# Patient Record
Sex: Female | Born: 1945 | Race: White | Hispanic: No | Marital: Married | Smoking: Never smoker
Health system: Southern US, Community
[De-identification: ages and names within clinical notes are randomized; demographics above are authoritative.]

## PROBLEM LIST (undated history)

## (undated) DIAGNOSIS — E7800 Pure hypercholesterolemia, unspecified: Secondary | ICD-10-CM

## (undated) DIAGNOSIS — I10 Essential (primary) hypertension: Secondary | ICD-10-CM

## (undated) DIAGNOSIS — M1711 Unilateral primary osteoarthritis, right knee: Secondary | ICD-10-CM

## (undated) HISTORY — PX: CHOLECYSTECTOMY: SHX55

---

## 2016-08-13 ENCOUNTER — Emergency Department (HOSPITAL_BASED_OUTPATIENT_CLINIC_OR_DEPARTMENT_OTHER): Payer: Medicare Other

## 2016-08-13 ENCOUNTER — Encounter (HOSPITAL_BASED_OUTPATIENT_CLINIC_OR_DEPARTMENT_OTHER): Payer: Self-pay | Admitting: *Deleted

## 2016-08-13 ENCOUNTER — Emergency Department (HOSPITAL_BASED_OUTPATIENT_CLINIC_OR_DEPARTMENT_OTHER)
Admission: EM | Admit: 2016-08-13 | Discharge: 2016-08-13 | Disposition: A | Discharge status: Home / Self Care | Payer: Medicare Other | Attending: Physician Assistant | Admitting: Physician Assistant

## 2016-08-13 DIAGNOSIS — R51 Headache: Secondary | ICD-10-CM | POA: Diagnosis not present

## 2016-08-13 DIAGNOSIS — Z7982 Long term (current) use of aspirin: Secondary | ICD-10-CM | POA: Insufficient documentation

## 2016-08-13 DIAGNOSIS — I10 Essential (primary) hypertension: Secondary | ICD-10-CM | POA: Insufficient documentation

## 2016-08-13 DIAGNOSIS — M79605 Pain in left leg: Secondary | ICD-10-CM | POA: Insufficient documentation

## 2016-08-13 DIAGNOSIS — M791 Myalgia: Secondary | ICD-10-CM | POA: Diagnosis not present

## 2016-08-13 DIAGNOSIS — Z79899 Other long term (current) drug therapy: Secondary | ICD-10-CM | POA: Diagnosis not present

## 2016-08-13 HISTORY — DX: Unilateral primary osteoarthritis, right knee: M17.11

## 2016-08-13 HISTORY — DX: Essential (primary) hypertension: I10

## 2016-08-13 HISTORY — DX: Pure hypercholesterolemia, unspecified: E78.00

## 2016-08-13 NOTE — Discharge Instructions (Signed)
Please make sure you follow up with your primary doctor for mri in the outpatient setting. 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 Warm compresses with compresses with ace wraps. No signs of blood clot. Return to the Ed if you develop chest pain or shortness or breath.

## 2016-08-13 NOTE — ED Provider Notes (Signed)
[facility] Provider Note   CSN: [id number] Arrival date & time: 08/13/16  1930   By signing my name below, I, Thelma Bargeick Cochran, attest that this documentation has been prepared under the direction and in the presence of Synetta ShadowKenneth Tyler Gerene Nedd. Electronically Signed: Thelma BargeNick Cochran, Scribe. 08/13/16. 9:39 PM.  History   Chief Complaint Chief Complaint  Patient presents with  . Leg Pain   The history is provided by the patient. No language interpreter was used.   HPI Comments: Jamie Mathews is a 71 y.o. female who presents to the Emergency Department complaining of constant left-sided leg pain that began 3 weeks ago [place] She states she thought it was her Atorvastatin medication so she stopped taking it in response. She called her PCP who recommended she come to the ED immediately for suspicion of blood clot after the pain did not improve after being off meds for 10 days. She has associated difficulty walking due to pain, minor swelling in that leg, and HA but notes this might be due to stress. She notes the pain worsens during the day and it feels like "charlie horses" that radiates to the front of her leg. Pt has no Hx of blood clots or PE. She takes aspirin daily.She denies CP and SOB. She also denies hormone replacements and any recent surgeries or hospitalizations.   Past Medical History:  Diagnosis Date  . High cholesterol   . Hypertension   . Osteoarthritis of right knee     There are no active problems to display for this patient.   Past Surgical History:  Procedure Laterality Date  . CHOLECYSTECTOMY      OB History    No data available       Home Medications    Prior to Admission medications   Medication Sig Start Date End Date Taking? Authorizing Provider  Aspirin (ASPIR-81 PO) Take by mouth.   Yes [provider]  losartan (COZAAR) 100 MG tablet Take 100 mg by mouth daily.   Yes [provider]    Family  History No family history on file.  Social History Social History  Substance Use Topics  . Smoking status: Never Smoker  . Smokeless tobacco: Never Used  . Alcohol use No     Allergies   Patient has no known allergies.   Review of Systems Review of Systems  Respiratory: Negative for shortness of breath.   Cardiovascular: Positive for leg swelling. Negative for chest pain.  Musculoskeletal: Positive for gait problem and myalgias.  Neurological: Positive for headaches.     Physical Exam Updated Vital Signs BP (!) 173/82   Pulse 77   Temp 97.9 F (36.6 C) (Oral)   Resp 18   Ht 5\' 4"  (1.626 m)   Wt 210 lb (95.3 kg)   SpO2 96%   BMI 36.05 kg/m   Physical Exam  Constitutional: She is oriented to person, place, and time. She appears well-developed and well-nourished.  HENT:  Head: Normocephalic and atraumatic.  Cardiovascular: Normal rate, regular rhythm, normal heart sounds and intact distal pulses.   Pulmonary/Chest: Effort normal and breath sounds normal.  Musculoskeletal:  Left calf TTP No ecchymosis or edema noted DP pulses 2+ bilaterally Full ROM in left knee but with pain  Neurological: She is alert and oriented to person, place, and time.  Sensation intact  Skin: Skin is warm and dry.  Capillary refill normal  Psychiatric: She has a normal mood and affect.  Nursing note and vitals reviewed.    ED Treatments / Results  DIAGNOSTIC STUDIES: Oxygen Saturation is 96% on RA, normal by my interpretation.    COORDINATION OF CARE: 9:34 PM Discussed treatment plan with pt at bedside and pt agreed to plan.  Labs (all labs ordered are listed, but only abnormal results are displayed) Labs Reviewed - No data to display  EKG  EKG Interpretation None       Radiology No results found.  Procedures Procedures (including critical care time)  Medications Ordered in ED Medications - No data to display   Initial Impression / Assessment and Plan / ED  Course  I have reviewed the triage vital signs and the nursing notes.  Pertinent labs & imaging results that were available during my care of the patient were reviewed by me and considered in my medical decision making (see chart for details).     Pt presents with right leg pain following long drive 1.5 weeks ago. Seen by pcp who sent to ED for DVT r/o. US performed that revealed no dvt but complex bakers cyst. PT is neurovascularly intact.Denies cp or sob.  Doubt PE. Symptomatic treatment discussed with pcp follow up and mri in outpatient setting. Pt is hemodynamically stable, in NAD, & able to ambulate in the ED. Pain has been managed & has no complaints prior to dc. Pt is comfortable with above plan and is stable for discharge at this time. All questions were answered prior to disposition. Strict return precautions for f/u to the ED were discussed. Pt seen by Dr. Juliann Pares who is agreeable to the above plan.   Final Clinical Impressions(s) / ED Diagnoses   Final diagnoses:  Left leg pain    New Prescriptions Discharge Medication List as of 08/13/2016 11:09 PM     I personally performed the services described in this documentation, which was scribed in my presence. The recorded information has been reviewed and is accurate.     Rise Mu, PA-C 08/15/16 1611    Abelino Derrick, MD 08/16/16 0009

## 2016-08-13 NOTE — ED Triage Notes (Signed)
Pain in her left leg since May 12th. Her MD is aware. She stopped her Atorvastatin 6 days ago with no relief. Her MD called her tonight and asked how she was and she was told to come straight to the ED to r/o DVT.

## 2018-06-17 IMAGING — US US EXTREM LOW VENOUS*L*
1 series · 13 of 24 positions shown · non-contrast
Comparison: None.

CLINICAL DATA: Pain posterior left thigh and popliteal fossa



[Series 1: us extrem low venous*left* · 0.09mm/px · 33 acquisitions, 13 frames shown]
[im 1/33]
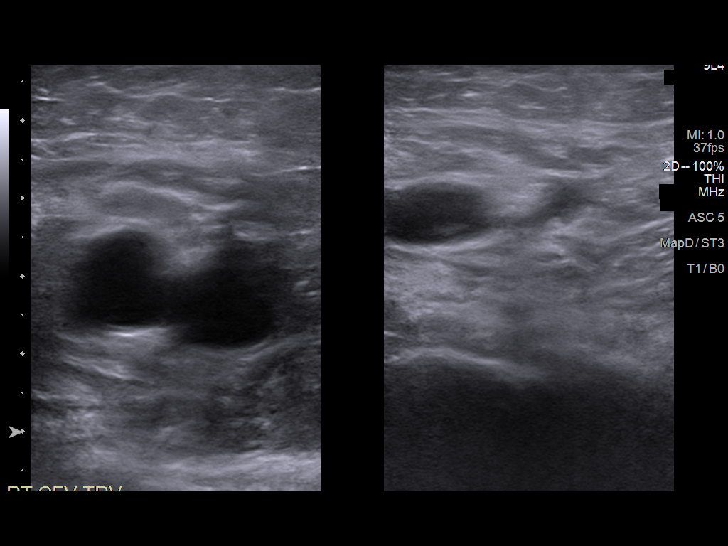
[im 3/33]
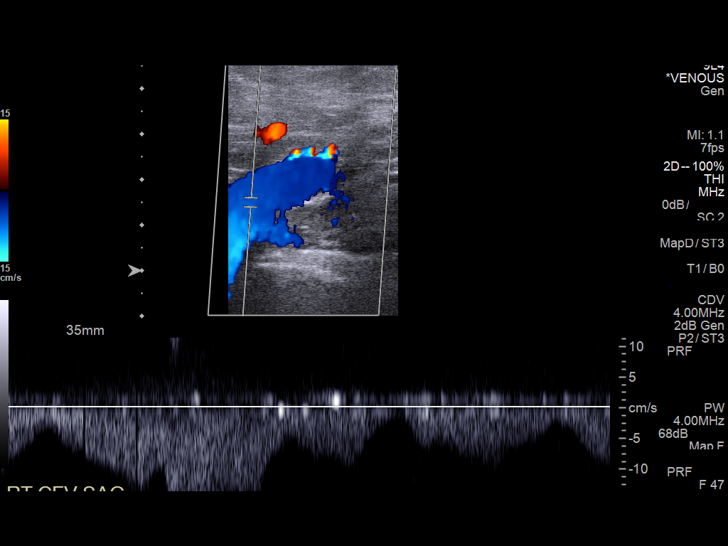
[im 6/33]
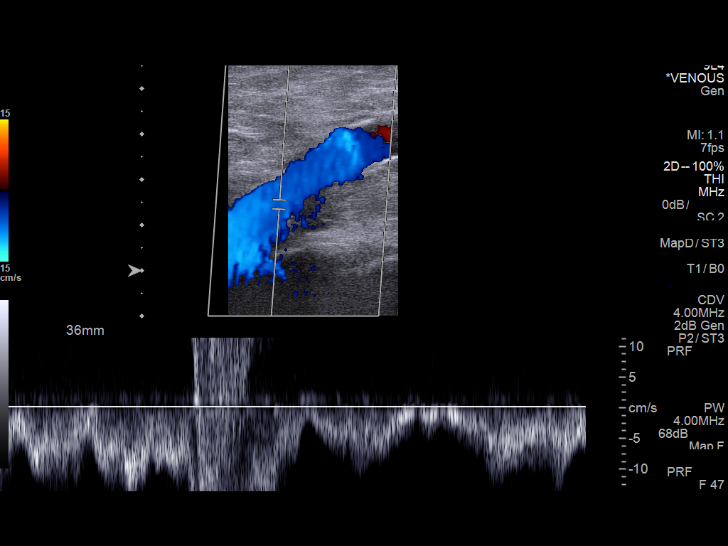
[im 9/33]
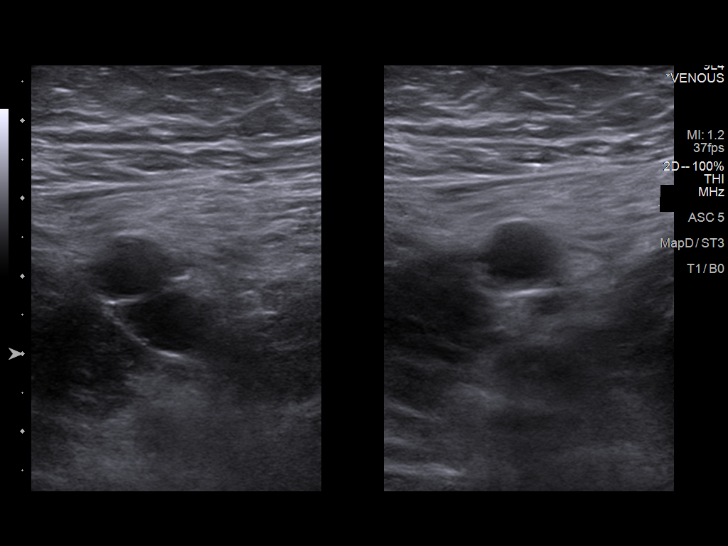
[im 12/33]
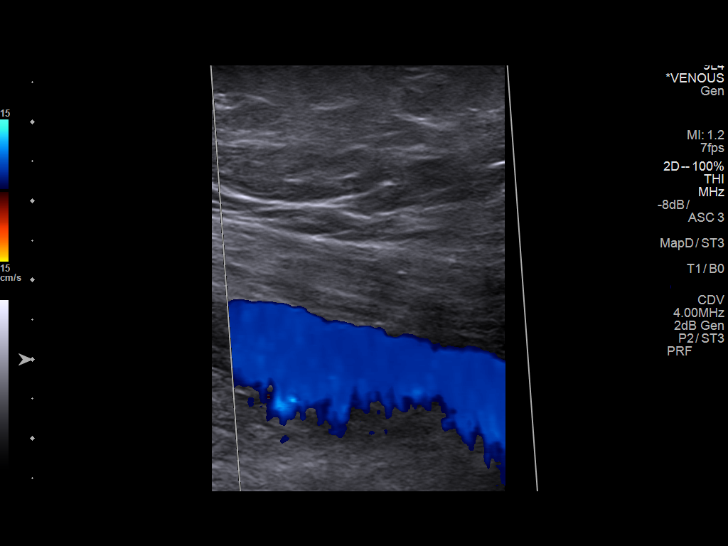
[im 14/33]
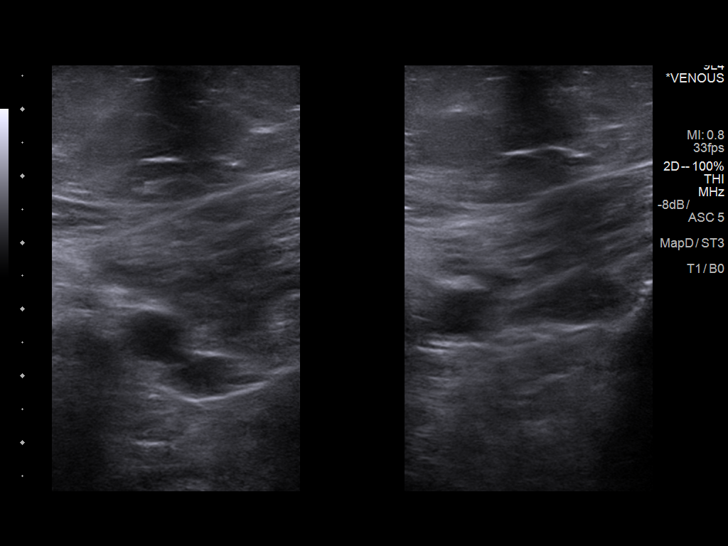
[im 19/33]
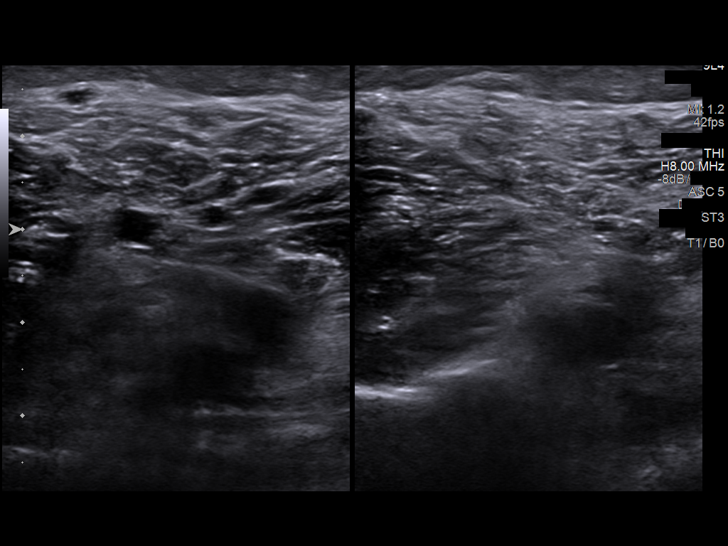
[im 20/33]
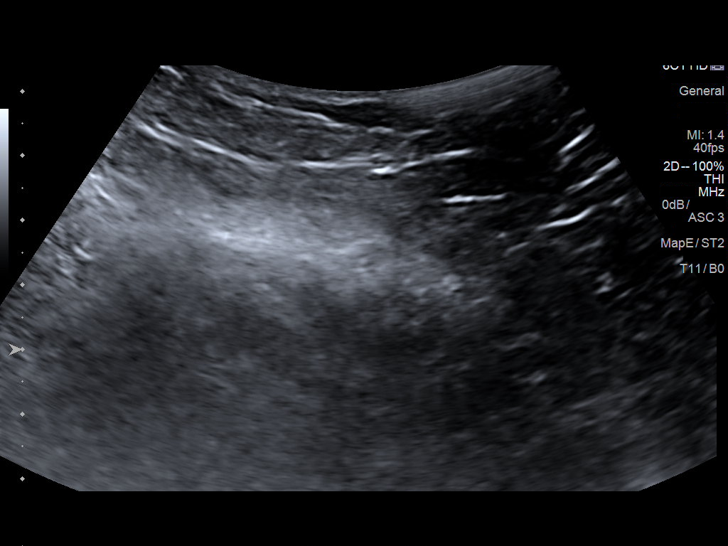
[im 21/33]
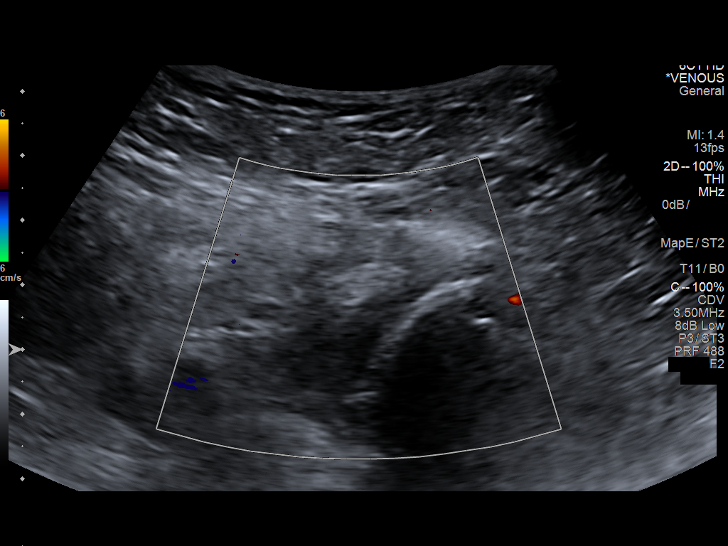
[im 24/33]
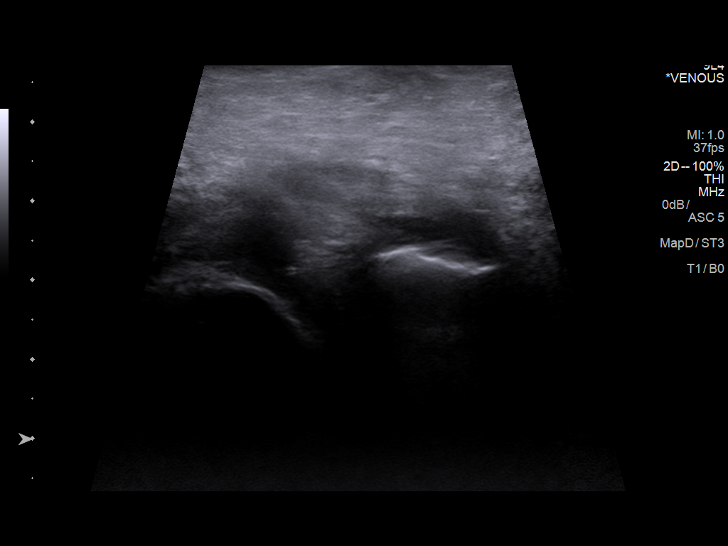
[im 27/33]
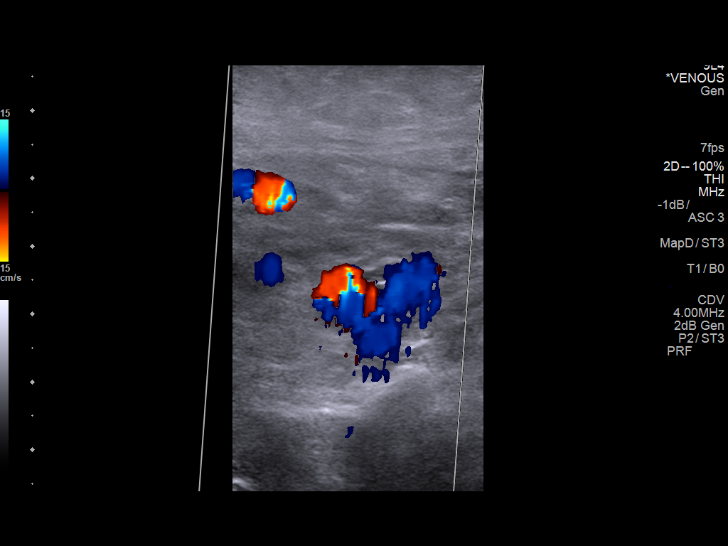
[im 30/33]
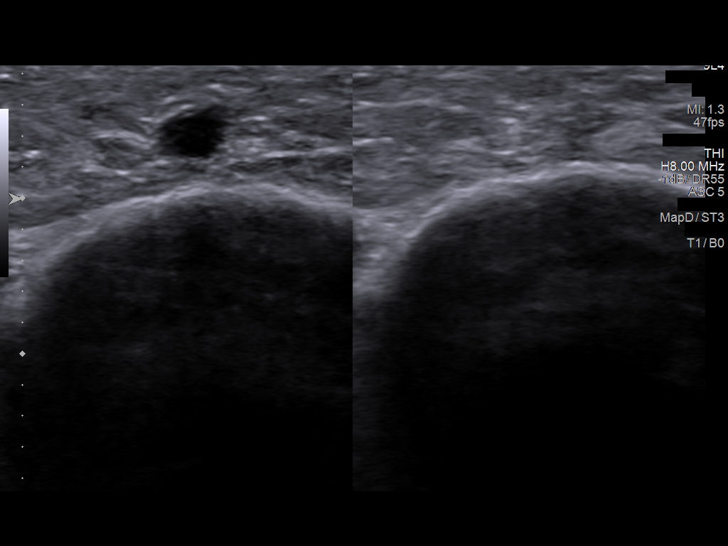
[im 33/33]
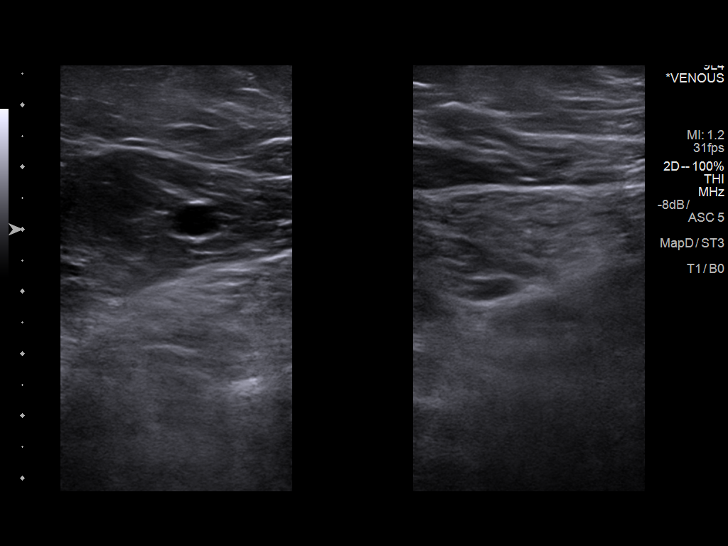

[13 of 24 positions shown; findings below may reference images not displayed]

FINDINGS: Contralateral Common Femoral Vein: Respiratory phasicity is normal
and symmetric with the symptomatic side. No evidence of thrombus.
Normal compressibility.

Common Femoral Vein: No evidence of thrombus. Normal
compressibility, respiratory phasicity and response to augmentation.

Saphenofemoral Junction: No evidence of thrombus. Normal
compressibility and flow on color Doppler imaging.

Profunda Femoral Vein: No evidence of thrombus. Normal
compressibility and flow on color Doppler imaging.

Femoral Vein: No evidence of thrombus. Normal compressibility,
respiratory phasicity and response to augmentation.

Popliteal Vein: No evidence of thrombus. Normal compressibility,
respiratory phasicity and response to augmentation.

Calf Veins: No evidence of thrombus. Normal compressibility and flow
on color Doppler imaging.

Other Findings: Heterogenous hypo echoic area in the popliteal fossa
in the region of pain measuring approximately 3.5 x 1.4 x 2 cm.
IMPRESSION: No evidence of DVT within the left lower extremity. Heterogenous
collection in the left popliteal fossa measuring 3.5 cm, possible
complicated Baker's cyst. Nonemergent MRI evaluation could be
obtained for confirmation.

## 2020-07-08 ENCOUNTER — Emergency Department (HOSPITAL_BASED_OUTPATIENT_CLINIC_OR_DEPARTMENT_OTHER)
Admission: EM | Admit: 2020-07-08 | Discharge: 2020-07-08 | Disposition: A | Discharge status: Home / Self Care | Payer: Medicare Other | Attending: Emergency Medicine | Admitting: Emergency Medicine

## 2020-07-08 ENCOUNTER — Encounter (HOSPITAL_BASED_OUTPATIENT_CLINIC_OR_DEPARTMENT_OTHER): Payer: Self-pay | Admitting: Emergency Medicine

## 2020-07-08 ENCOUNTER — Other Ambulatory Visit: Payer: Self-pay

## 2020-07-08 DIAGNOSIS — Z7982 Long term (current) use of aspirin: Secondary | ICD-10-CM | POA: Insufficient documentation

## 2020-07-08 DIAGNOSIS — I10 Essential (primary) hypertension: Secondary | ICD-10-CM | POA: Diagnosis not present

## 2020-07-08 DIAGNOSIS — M7671 Peroneal tendinitis, right leg: Secondary | ICD-10-CM | POA: Diagnosis not present

## 2020-07-08 DIAGNOSIS — M79671 Pain in right foot: Secondary | ICD-10-CM | POA: Diagnosis present

## 2020-07-08 NOTE — Discharge Instructions (Signed)
Please read the attachment on peroneal tendinopathy.  Continue with NSAIDs and Tylenol, as directed.  Please call the office of your podiatrist on Monday to schedule appointment as soon as possible.  They want to know that your symptoms have worsened and should try to expedite your appointment.  I hope that you can attend and be able to ambulate at your grandson's graduation.  Please continue to try your best to rest the foot is much as possible in interim.  Return to the ER or seek immediate medical attention should you experience any new or worsening symptoms.

## 2020-07-08 NOTE — ED Provider Notes (Signed)
[facility] EMERGENCY DEPARTMENT Provider Note   CSN: [id number] Arrival date & time: 07/08/20  1530     History Chief Complaint  Patient presents with  . Foot Pain    Jamie Mathews is a 75 y.o. female with PMH significant for DM currently being treated by a podiatrist for peroneal tendinitis who presents the ED with complaints of right foot pain.   I reviewed her medical record and she was encouraged to wear supportive shoes and ASO ankle brace.  She was evaluated 06/20/2020 and encouraged to return to the clinic in 3 to 4 weeks, sooner if needed.  She does endorse a history of plantar fasciitis, but states this feels different.  She describes a ball on the bottom of her foot.  She states that it really became painful today.  Her grandson is graduating from college next weekend and she needs to be ambulatory.  She states that when she does not walk, her pain goes away.  However, when she begins walking again, the pain returns.  She is in a wheelchair and states that she has significant pain with ambulation at this time.  She was unsure if we be able to help her here in the ED, but wanted come to the ER for evaluation.  She was encouraged by her family.  She was wondering if we could be placed in a cast that would make it easier for her to walk.  She tried buying supportive footwear, no relief.  No recent fevers, new injuries, numbness or weakness, or any other symptoms.  HPI     Past Medical History:  Diagnosis Date  . High cholesterol   . Hypertension   . Osteoarthritis of right knee     There are no problems to display for this patient.   Past Surgical History:  Procedure Laterality Date  . CHOLECYSTECTOMY       OB History   No obstetric history on file.     History reviewed. No pertinent family history.  Social History   Tobacco Use  . Smoking status: Never Smoker  . Smokeless tobacco: Never Used  Substance Use Topics  . Alcohol use: No  . Drug  use: No    Home Medications Prior to Admission medications   Medication Sig Start Date End Date Taking? Authorizing Provider  Aspirin (ASPIR-81 PO) Take by mouth.    [provider]  losartan (COZAAR) 100 MG tablet Take 100 mg by mouth daily.    [provider]    Allergies    Patient has no known allergies.  Review of Systems   Review of Systems  All other systems reviewed and are negative.   Physical Exam Updated Vital Signs BP (!) 116/100 (BP Location: Left Arm)   Pulse 85   Temp 98.2 F (36.8 C) (Oral)   Resp 16   Ht 5\' 4"  (1.626 m)   Wt 92.1 kg   SpO2 96%   BMI 34.84 kg/m   Physical Exam Vitals and nursing note reviewed. Exam conducted with a chaperone present.  Constitutional:      Appearance: Normal appearance.  HENT:     Head: Normocephalic and atraumatic.  Eyes:     General: No scleral icterus.    Conjunctiva/sclera: Conjunctivae normal.  Cardiovascular:     Rate and Rhythm: Normal rate.     Pulses: Normal pulses.  Pulmonary:     Effort: Pulmonary effort is normal. No respiratory distress.  Musculoskeletal:  General: No tenderness or signs of injury. Normal range of motion.     Comments: Right foot: Pedal pulse intact and symmetrical contralateral foot.  She is able to exhibit dorsiflexion and plantar flexion of the ankle.  She can wiggle her toes.  Sensation is intact throughout.  She describes tenderness on plantar lateral aspect of foot.  Cannot reproduce here unless she ambulates.  No overlying skin changes.  No swelling.  No redness or warmth.  Skin:    General: Skin is dry.  Neurological:     Mental Status: She is alert.     GCS: GCS eye subscore is 4. GCS verbal subscore is 5. GCS motor subscore is 6.  Psychiatric:        Mood and Affect: Mood normal.        Behavior: Behavior normal.        Thought Content: Thought content normal.     ED Results / Procedures / Treatments   Labs (all labs ordered are listed, but  only abnormal results are displayed) Labs Reviewed - No data to display  EKG None  Radiology No results found.  Procedures Procedures   Medications Ordered in ED Medications - No data to display  ED Course  I have reviewed the triage vital signs and the nursing notes.  Pertinent labs & imaging results that were available during my care of the patient were reviewed by me and considered in my medical decision making (see chart for details).    MDM Rules/Calculators/A&P                          Jamie Mathews was evaluated in Emergency Department on 07/08/2020 for the symptoms described in the history of present illness. She was evaluated in the context of the global COVID-19 pandemic, which necessitated consideration that the patient might be at risk for infection with the SARS-CoV-2 virus that causes COVID-19. Institutional protocols and algorithms that pertain to the evaluation of patients at risk for COVID-19 are in a state of rapid change based on information released by regulatory bodies including the CDC and federal and state organizations. These policies and algorithms were followed during the patient's care in the ED.  I personally reviewed patient's medical chart and all notes from triage and staff during today's encounter. I have also ordered and reviewed all labs and imaging that I felt to be medically necessary in the evaluation of this patient's complaints and with consideration of their physical exam. If needed, translation services were available and utilized.   Patient with plantar peroneal tenderness already followed by podiatry who presents the ED inquiring if we can assist.  She initially expressed concern for fracture due to her worsening pain symptoms, but denies any new injuries.  I offered plain films for further evaluation, she declined.  She is simply frustrated because she feels as though it improves and so she tries to walk and hurts again.  I explained that it  is a weightbearing as tolerated injury and that she needs to give it rest.  Continue with NSAIDs and Tylenol, as directed.  There are no overlying skin changes concerning for inflammatory arthritis.  She demonstrates good range of motion.  Nothing to suggest septic joint.  She is functionally and neurovascularly intact.  She will call her podiatrist on Monday.  ER return precautions discussed.  Patient voiced understanding and is agreeable to the plan.  Final Clinical Impression(s) / ED Diagnoses Final diagnoses:  Peroneal tendinitis of right lower extremity    Rx / DC Orders ED Discharge Orders    None       Lorelee New, PA-C 07/08/20 1752    Sabino Donovan, MD 07/10/20 [phone]

## 2020-07-08 NOTE — ED Triage Notes (Signed)
Pt arrives pov with c/o R foot pain, lateral and on the bottom. Pt has soft shoe on, being treated by ortho provider.
